# Patient Record
Sex: Male | Born: 2005 | Race: Black or African American | Hispanic: No | Marital: Single | Smoking: Never smoker
Health system: Southern US, Community
[De-identification: ages and names within clinical notes are randomized; demographics above are authoritative.]

---
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
## 2023-03-25 ENCOUNTER — Ambulatory Visit: Payer: Self-pay

## 2023-08-31 DIAGNOSIS — J00 Acute nasopharyngitis [common cold]: Secondary | ICD-10-CM | POA: Diagnosis not present

## 2023-08-31 DIAGNOSIS — Z1152 Encounter for screening for COVID-19: Secondary | ICD-10-CM | POA: Diagnosis not present

## 2023-10-19 DIAGNOSIS — R112 Nausea with vomiting, unspecified: Secondary | ICD-10-CM | POA: Diagnosis not present

## 2023-12-05 DIAGNOSIS — R1030 Lower abdominal pain, unspecified: Secondary | ICD-10-CM | POA: Diagnosis not present

## 2023-12-05 DIAGNOSIS — R300 Dysuria: Secondary | ICD-10-CM | POA: Diagnosis not present

## 2023-12-09 ENCOUNTER — Other Ambulatory Visit (HOSPITAL_COMMUNITY)
Admission: RE | Admit: 2023-12-09 | Discharge: 2023-12-09 | Disposition: A | Discharge status: Home / Self Care | Source: Ambulatory Visit | Attending: Family Medicine | Admitting: Family Medicine

## 2023-12-09 ENCOUNTER — Ambulatory Visit (INDEPENDENT_AMBULATORY_CARE_PROVIDER_SITE_OTHER): Admitting: Student

## 2023-12-09 VITALS — BP 104/64 | HR 57 | Temp 97.9°F | Ht 69.0 in | Wt 164.2 lb

## 2023-12-09 DIAGNOSIS — R1030 Lower abdominal pain, unspecified: Secondary | ICD-10-CM

## 2023-12-09 NOTE — Progress Notes (Signed)
    SUBJECTIVE:   CHIEF COMPLAINT / HPI: Stomach pain  Discussed the use of AI scribe software for clinical note transcription with the patient, who gave verbal consent to proceed.  Seen in urgent care on 6/15 for lower abdominal pain-x-ray at that time of abdomen normal, at that time had normal testicular examination. Reported he may have urinated out a stone. UA did not have signs of infection.  Abdomen was benign.  Thought to be related to gas and was prescribed Maalox.  History of Present Illness Joe White is a 18 year old male who presents with persistent stomach pain and nausea.  He has experienced stomach pain for nine days, typically after eating, with nausea starting about an hour after meals. Symptoms last two to three hours and become intermittent at night. Pain is localized to the lower stomach only. Bowel movements are normal, and there is no vomiting or fever. No blood in stool. No family hx of IBD or bowel disorders.  An abdominal x-ray and other tests at urgent care showed no abnormalities. Maalox was prescribed but did not relieve symptoms. He recalls a similar sensation to a stomach virus a month ago, without vomiting.  There was a one-time incident of pain during urination on 01-11-2024, with the passage of a solid object, but tests showed no abnormalities. He drinks about a gallon of water daily. Occasional left testicular pain was more pronounced on January 11, 2024.'  Denies any new sexual partners.  Denies any discharge.  I   PERTINENT  PMH / PSH: hx hematuria,  OBJECTIVE:   BP 104/64   Pulse (!) 57   Temp 97.9 F (36.6 C) (Oral)   Ht 5' 9 (1.753 m)   Wt 164 lb 4 oz (74.5 kg)   SpO2 99%   BMI 24.26 kg/m   General: Well appearing, NAD, awake, alert, responsive to questions Head: Normocephalic atraumatic CV: Regular rate and rhythm no murmurs rubs or gallops Respiratory: Clear to ausculation bilaterally, no wheezes rales or crackles, chest rises symmetrically,  no  increased work of breathing Abdomen: Soft, non-tender, non-distended, normoactive bowel sounds, no CVA tenderness, neg murphy's sign Extremities: Moves upper and lower extremities freely GU: Testicular exam with cremasteric reflex bilaterally, no tenderness to palpation bilaterally, no masses or erythema, no penile discharge or lesions on examination, no inguinal hernias palpated  CMA Elonda Hale present during GU exam  ASSESSMENT/PLAN:   Assessment & Plan Lower abdominal pain Intermittent postprandial lower abdominal pain with nausea, unresponsive to Maalox. Differential includes food intolerance, STI (less liekly), IBD (less likely) Intermittent left testicular pain with no abnormalities on examination.  - Monitor symptoms and dietary triggers in food diary and 2 week follow up - Urine cytology ancillary only - Declines blood testing today - FUTURE orders for CBC, CMP, Hepatitis C, HIV antibody, RPR, Celiac Panel - ED/return precautions   Genora Kidd, MD [facility]

## 2023-12-09 NOTE — Patient Instructions (Signed)
 It was great to see you! Thank you for allowing me to participate in your care!   Our plans for today:  - I am getting some labs today and will let you know what these show - Please trial simethicone at home as well and write a food diary and what symptoms you have and follow up in 2 weeks  Take care and seek immediate care sooner if you develop any concerns.  Genora Kidd, MD

## 2023-12-10 ENCOUNTER — Ambulatory Visit: Payer: Self-pay | Admitting: Student

## 2023-12-10 LAB — URINE CYTOLOGY ANCILLARY ONLY
Chlamydia: NEGATIVE
Comment: NEGATIVE
Comment: NORMAL
Neisseria Gonorrhea: NEGATIVE

## 2023-12-13 ENCOUNTER — Other Ambulatory Visit

## 2023-12-15 ENCOUNTER — Other Ambulatory Visit

## 2023-12-15 DIAGNOSIS — R1030 Lower abdominal pain, unspecified: Secondary | ICD-10-CM

## 2023-12-16 ENCOUNTER — Ambulatory Visit: Payer: Self-pay | Admitting: Student

## 2023-12-16 LAB — HEPATITIS C ANTIBODY: Hep C Virus Ab: NONREACTIVE

## 2023-12-16 LAB — CELIAC DISEASE COMPREHENSIVE PANEL WITH REFLEXES

## 2023-12-16 LAB — RPR

## 2023-12-16 LAB — HIV ANTIBODY (ROUTINE TESTING W REFLEX): HIV Screen 4th Generation wRfx: NONREACTIVE

## 2023-12-17 LAB — COMPREHENSIVE METABOLIC PANEL WITH GFR
ALT: 21 IU/L (ref 0–44)
AST: 20 IU/L (ref 0–40)
Albumin: 4.6 g/dL (ref 4.3–5.2)
Alkaline Phosphatase: 64 IU/L (ref 51–125)
BUN/Creatinine Ratio: 5 — ABNORMAL LOW (ref 9–20)
BUN: 7 mg/dL (ref 6–20)
Bilirubin Total: 0.4 mg/dL (ref 0.0–1.2)
CO2: 21 mmol/L (ref 20–29)
Calcium: 9.5 mg/dL (ref 8.7–10.2)
Chloride: 102 mmol/L (ref 96–106)
Creatinine, Ser: 1.31 mg/dL — ABNORMAL HIGH (ref 0.76–1.27)
Globulin, Total: 2.5 g/dL (ref 1.5–4.5)
Glucose: 84 mg/dL (ref 70–99)
Potassium: 3.7 mmol/L (ref 3.5–5.2)
Sodium: 142 mmol/L (ref 134–144)
Total Protein: 7.1 g/dL (ref 6.0–8.5)
eGFR: 80 mL/min/{1.73_m2} (ref >59)

## 2023-12-17 LAB — CBC WITH DIFFERENTIAL/PLATELET
Basophils Absolute: 0 10*3/uL (ref 0.0–0.2)
Basos: 1 %
EOS (ABSOLUTE): 1.3 10*3/uL — ABNORMAL HIGH (ref 0.0–0.4)
Eos: 24 %
Hematocrit: 46.7 % (ref 37.5–51.0)
Hemoglobin: 14.6 g/dL (ref 13.0–17.7)
Immature Grans (Abs): 0 10*3/uL (ref 0.0–0.1)
Immature Granulocytes: 0 %
Lymphocytes Absolute: 1.6 10*3/uL (ref 0.7–3.1)
Lymphs: 29 %
MCH: 28.6 pg (ref 26.6–33.0)
MCHC: 31.3 g/dL — ABNORMAL LOW (ref 31.5–35.7)
MCV: 91 fL (ref 79–97)
Monocytes Absolute: 0.4 10*3/uL (ref 0.1–0.9)
Monocytes: 7 %
Neutrophils Absolute: 2.1 10*3/uL (ref 1.4–7.0)
Neutrophils: 39 %
Platelets: 202 10*3/uL (ref 150–450)
RBC: 5.11 x10E6/uL (ref 4.14–5.80)
RDW: 12.7 % (ref 11.6–15.4)
WBC: 5.4 10*3/uL (ref 3.4–10.8)

## 2023-12-17 LAB — CELIAC DISEASE COMPREHENSIVE PANEL WITH REFLEXES: IgA/Immunoglobulin A, Serum: 103 mg/dL (ref 90–386)

## 2023-12-17 LAB — RPR: RPR Ser Ql: NONREACTIVE

## 2023-12-20 NOTE — Telephone Encounter (Signed)
 Attempted to reach patient. To make appt with PCP to discuss lab results. No answer. Unable to LVM due to one not being set up. Nelson Land, CMA

## 2023-12-20 NOTE — Telephone Encounter (Signed)
-----   Message from [facility] sent at 12/20/2023 12:30 AM EDT ----- Can you please schedule PCP appt for discussing lab results ----- Message ----- From: Anders Otto DASEN, MD Sent: 12/18/2023   2:00 PM EDT To: Wendel Lesch, MD; Lauraine Norse, DO  Pls have your CMA reach out to patient to schedule PCP appointment to discuss test results. I don't see that he read your MyChart message. Thanks.  ----- Message ----- From: Rebecka Memos Lab Results In Sent: 12/16/2023   8:13 AM EDT To: Otto DASEN Anders, MD

## 2024-01-04 NOTE — Telephone Encounter (Signed)
 Test result discussed with him. Hydration recommended. PCP appointment scheduled this Thursday to recheck his creatinine level I also sent him a link to reactivate his MyChart account. He was appreciative of the call.  New PCP notified.

## 2024-01-06 ENCOUNTER — Ambulatory Visit: Payer: Self-pay | Admitting: Family Medicine

## 2024-01-07 ENCOUNTER — Ambulatory Visit (INDEPENDENT_AMBULATORY_CARE_PROVIDER_SITE_OTHER): Admitting: Family Medicine

## 2024-01-07 VITALS — BP 109/59 | HR 65 | Ht 69.0 in | Wt 165.2 lb

## 2024-01-07 DIAGNOSIS — R1030 Lower abdominal pain, unspecified: Secondary | ICD-10-CM | POA: Diagnosis not present

## 2024-01-07 DIAGNOSIS — R7989 Other specified abnormal findings of blood chemistry: Secondary | ICD-10-CM

## 2024-01-07 NOTE — Patient Instructions (Signed)
 It was so good to see you today! Thank you for allowing me to take care of you.  Today we discussed the following concerns and plans:  Lab work - your labs from last month all looked great! The only concern was a slightly high creatinine (kidney function). I expect this was related to your recent food poisoning. - We rechecked your creatinine today and I will let you know the results.  If you have any concerns, please call the clinic or schedule an appointment.  It was a pleasure to take care of you today. Be well!  Lauraine Norse, DO [facility], PGY-2  Do you need your medications delivered to your home?   We'll send your prescription to the [facility] for delivery.          Address: [address] [place], [place], [place] [address]          Phone: [phone]  Please call the [facility] to speak with a pharmacist and set up your home medication delivery. If you have any questions, feel free to contact us  -- we're happy to help!  Other [place] [facility] that offer affordable prices on both prescriptions and over-the-counter items, as well as convenient services like vaccinations, are  [facility], at [facility]         Address:  [address], [place], [place] [address]         Phone: [phone]  [facility], located in the [facility]        Address: [address] [place], [place] [address]        Phone: [phone]  [facility], at [facility]       Address: [address], [place], [place] [address]       Phone: [phone]  [facility], at [facility]       Address: [address], [place], [place] [address]       Phone: [phone]

## 2024-01-07 NOTE — Progress Notes (Signed)
    SUBJECTIVE:   CHIEF COMPLAINT / HPI:   AKI f/u Appt on 6/19 for lower abdominal pain. Labs collected at that time were WNL except for creatinine which was mildly elevated to 1.31 (baseline is likely closer to 1). He returns today to have this rechecked. He has been eating and drinking normally. No N/V/D.  No further abdominal pain. He does share with me that a few days prior to this lab work he thinks he had food poisoning. He and a friend went out to eat and had the same food, and both got sick at the same time with severe stomach discomfort, nausea. He had trouble staying hydrated at that time due to stomach pain.  PERTINENT  PMH / PSH: Reviewed.  OBJECTIVE:   BP (!) 109/59   Pulse 65   Ht 5' 9 (1.753 m)   Wt 165 lb 4 oz (75 kg)   SpO2 99%   BMI 24.40 kg/m   General: well-appearing, no acute distress. HEENT: normocephalic, PERRLA, MMM. Cardio: Regular rate, regular rhythm, no murmurs on exam. Pulm: Clear, no wheezing, no crackles. No increased work of breathing. Abdominal: bowel sounds present, soft, non-tender, non-distended. Extremities: no peripheral edema. Moves all extremities equally. Neuro: Alert and oriented x3, speech normal in content, no facial asymmetry. Psych:  Cognition and judgment appear intact. Alert, communicative, and cooperative.   ASSESSMENT/PLAN:   Assessment & Plan Elevated serum creatinine 1 month ago was elevated at 1.31 - not high enough to represent true AKI. Suspect this was related to possible food poisoning per pt. All symptoms resolved now - I anticipate repeat creatinine will return WNL. - BMP today - encouraged ongoing good hydration   Lauraine Norse, DO [facility] Center

## 2024-01-08 LAB — BASIC METABOLIC PANEL WITH GFR
BUN/Creatinine Ratio: 10 (ref 9–20)
BUN: 11 mg/dL (ref 6–20)
CO2: 22 mmol/L (ref 20–29)
Calcium: 9.2 mg/dL (ref 8.7–10.2)
Chloride: 101 mmol/L (ref 96–106)
Creatinine, Ser: 1.15 mg/dL (ref 0.76–1.27)
Glucose: 83 mg/dL (ref 70–99)
Potassium: 4.6 mmol/L (ref 3.5–5.2)
Sodium: 138 mmol/L (ref 134–144)
eGFR: 94 mL/min/1.73 (ref >59)

## 2024-01-10 ENCOUNTER — Ambulatory Visit: Payer: Self-pay | Admitting: Family Medicine

## 2024-01-10 NOTE — Telephone Encounter (Signed)
-----   Message from Lauraine Norse sent at 01/10/2024  7:24 AM EDT ----- Regarding: results Please call pt and let him know his BMP looks normal - specifically mildly elevated creatinine from prior BMP is resolved (healthy kidneys).  Thanks, Lauraine

## 2024-01-10 NOTE — Telephone Encounter (Signed)
 Spoke to patient and informed him that his BMP was normal.  .Rosaline JONELLE Pesa, CMA

## 2024-03-24 ENCOUNTER — Encounter (HOSPITAL_COMMUNITY): Payer: Self-pay

## 2024-03-24 ENCOUNTER — Ambulatory Visit (HOSPITAL_COMMUNITY)
Admission: EM | Admit: 2024-03-24 | Discharge: 2024-03-24 | Disposition: A | Discharge status: Home / Self Care | Attending: Physician Assistant | Admitting: Physician Assistant

## 2024-03-24 DIAGNOSIS — H938X3 Other specified disorders of ear, bilateral: Secondary | ICD-10-CM | POA: Diagnosis not present

## 2024-03-24 DIAGNOSIS — H9192 Unspecified hearing loss, left ear: Secondary | ICD-10-CM | POA: Diagnosis not present

## 2024-03-24 DIAGNOSIS — H6123 Impacted cerumen, bilateral: Secondary | ICD-10-CM | POA: Diagnosis not present

## 2024-03-24 MED ORDER — CARBAMIDE PEROXIDE 6.5 % OT SOLN
5.0000 [drp] | Freq: Once | OTIC | Status: AC
  Administered 2024-03-24: 5 [drp] via OTIC

## 2024-03-24 MED ORDER — CARBAMIDE PEROXIDE 6.5 % OT SOLN
OTIC | Status: AC
  Filled 2024-03-24: qty 15

## 2024-03-24 NOTE — ED Provider Notes (Signed)
 [facility]    CSN: [id number] Arrival date & time: 03/24/24  1731      History   Chief Complaint Chief Complaint  Patient presents with   Ear Fullness    HPI Joe White is a 18 y.o. male.   Patient presents today with a prolonged history of bilateral ear fullness that has become worse in the past few days to the point that he had difficulty hearing out of his left ear earlier today.  He reports that this has opened up some but he is now experiencing the same sensation in his right ear.  He did go to the [facility] and they gave him a nasal spray (fluticasone  nasal spray) but this has been ineffective.  He has not tried any eardrops.  He did use a Q-tip earlier today but does not generally use Q-tips regularly and does not use earbuds or earplugs on a regular basis.  Denies any recent illness or additional symptoms including cough, congestion, fever.  Denies any otorrhea.  He has had cerumen impactions in the past with similar presentation that required irrigation for resolution of symptoms and is hoping that this is something we can provide today.    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 No pertinent surgical history.     Home Medications    Prior to Admission medications   Medication Sig Start Date End Date Taking? Authorizing Provider   fluticasone  (FLONASE ) 50 MCG/ACT nasal spray Place 2 sprays into both nostrils daily. Patient taking differently: Place 2 sprays into both nostrils as needed. 02/29/20   Lenon Chiquita BROCKS, DO  ibuprofen  (ADVIL ) 800 MG tablet Take 1 tablet (800 mg total) by mouth 3 (three) times daily with meals. 08/25/22   Rolinda Rogue, MD    Family History Family History  Problem Relation Age of Onset   Cancer Father    Diabetes Maternal Grandmother    Hypertension Maternal Grandmother     Social History Social History   Tobacco Use   Smoking status: Never   Smokeless tobacco: Never  Substance Use Topics   Alcohol use: Never   Drug use: Never     Allergies   Patient has no known allergies.   Review of Systems Review of Systems  Constitutional:  Positive for activity change. Negative for appetite change, fatigue and fever.  HENT:  Positive for hearing loss (bilateral ears). Negative for congestion, ear discharge, ear pain, sinus pressure, sneezing and sore throat.   Respiratory:  Negative for cough.   Gastrointestinal:  Negative for diarrhea, nausea and vomiting.  Neurological:  Negative for dizziness, light-headedness and headaches.     Physical Exam Triage Vital Signs ED Triage Vitals  Encounter Vitals Group     BP 03/24/24 1809 111/66     Girls Systolic BP Percentile --  Girls Diastolic BP Percentile --      Boys Systolic BP Percentile --      Boys Diastolic BP Percentile --      Pulse Rate 03/24/24 1809 (!) 52     Resp 03/24/24 1809 16     Temp 03/24/24 1809 98.8 F (37.1 C)     Temp Source 03/24/24 1809 Oral     SpO2 03/24/24 1809 97 %     Weight --      Height --      Head Circumference --      Peak Flow --      Pain Score 03/24/24 1808 0     Pain Loc --      Pain Education --      Exclude from Growth Chart --    No data found.  Updated Vital Signs BP 111/66 (BP Location: Right Arm)   Pulse (!) 52   Temp 98.8 F (37.1 C) (Oral)   Resp 16   SpO2 97%    Visual Acuity Right Eye Distance:   Left Eye Distance:   Bilateral Distance:    Right Eye Near:   Left Eye Near:    Bilateral Near:     Physical Exam Vitals reviewed.  Constitutional:      General: He is awake.     Appearance: Normal appearance. He is well-developed. He is not ill-appearing.     Comments: Very pleasant male appears stated age in no acute distress sitting comfortably in exam room  HENT:     Head: Normocephalic and atraumatic.     Right Ear: External ear normal. There is impacted cerumen.     Left Ear: External ear normal. There is impacted cerumen.     Ears:     Comments: Cerumen impaction noted bilaterally.  Unable to visualize TM.  We were able to remove some cerumen but not enough to visualize the TM.    Nose: Nose normal.     Mouth/Throat:     Pharynx: Uvula midline. No oropharyngeal exudate, posterior oropharyngeal erythema or uvula swelling.  Cardiovascular:     Rate and Rhythm: Normal rate and regular rhythm.     Heart sounds: Normal heart sounds, S1 normal and S2 normal. No murmur heard. Pulmonary:     Effort: Pulmonary effort is normal. No accessory muscle usage or respiratory distress.     Breath sounds: Normal breath sounds. No stridor. No wheezing, rhonchi or rales.     Comments: Clear to auscultation bilaterally Abdominal:     General: Bowel sounds are normal.     Palpations: Abdomen is soft.     Tenderness: There is no abdominal tenderness.  Neurological:     Mental Status: He is alert.  Psychiatric:        Behavior: Behavior is cooperative.      UC Treatments / Results  Labs (all labs ordered are listed, but only abnormal results are displayed) Labs Reviewed - No data to display  EKG   Radiology No results found.  Procedures Procedures (including critical care time)  Medications Ordered in UC Medications  carbamide peroxide (DEBROX) 6.5 % OTIC (EAR) solution 5 drop (5 drops Both EARS Given 03/24/24 1903)    Initial  Impression / Assessment and Plan / UC Course  I have reviewed the triage vital signs and the nursing notes.  Pertinent labs & imaging results that were available during my care of the patient were reviewed by me and considered in my medical decision  making (see chart for details).     Patient is well-appearing, afebrile, nontoxic, nontachycardic.  Cerumen impaction noted bilaterally we discussed that this is a likely cause of his symptoms.  He is not experiencing any significant pain and so low suspicion for otitis media is contributing to symptoms.  We attempted to irrigate this but were unsuccessful and so applied Debrox and allowed this to sit for 20 minutes before attempting again but unfortunately were still unable to remove all of the cerumen.  We discussed potentiality of starting antibiotics since we cannot visualize the TM and cannot be sure that he does not have otitis media, however, as he is not experiencing any pain or fever this was deferred.  He was sent home with Debrox with instruction to continue using this medication and he can return here in a few days for us  to reattempt irrigating the cerumen.  We also discussed that he can follow-up with ENT as they have additional tools that can help manage cerumen impaction and was given the contact information for local provider with instruction to call to schedule an appointment.  We discussed that if anything worsens or changes he should return for reevaluation.  Strict return precautions given.  All questions answered to patient satisfaction.  Final Clinical Impressions(s) / UC Diagnoses   Final diagnoses:  Bilateral impacted cerumen  Ear fullness, bilateral     Discharge Instructions      We were unable to completely remove all of the wax.  Please use Debrox for the next several days and then follow-up with ear nose and throat to help get the wax out.  If you have trouble seeing them you can return here and we can try to rinse your  ears again after using this medication for a few days.  If you develop any pain, drainage, fever you should be seen immediately.     ED Prescriptions   None    PDMP not reviewed this encounter.   Sherrell Rocky POUR, PA-C 03/24/24 1914

## 2024-03-24 NOTE — Discharge Instructions (Addendum)
 We were unable to completely remove all of the wax.  Please use Debrox for the next several days and then follow-up with ear nose and throat to help get the wax out.  If you have trouble seeing them you can return here and we can try to rinse your ears again after using this medication for a few days.  If you develop any pain, drainage, fever you should be seen immediately.

## 2024-03-24 NOTE — ED Triage Notes (Signed)
 Pt present loss of hearing,  symptoms started a few days ago, pt states it feels muffled.

## 2024-04-24 DIAGNOSIS — M7651 Patellar tendinitis, right knee: Secondary | ICD-10-CM | POA: Diagnosis not present
